# Patient Record
Sex: Male | Born: 1970 | Race: Black or African American | Hispanic: No | Marital: Married
Health system: Southern US, Community
[De-identification: ages and names within clinical notes are randomized; demographics above are authoritative.]

---

## 2006-01-14 ENCOUNTER — Emergency Department: Payer: Self-pay | Admitting: General Practice

## 2009-02-19 ENCOUNTER — Emergency Department: Payer: Self-pay | Admitting: Emergency Medicine

## 2009-11-06 ENCOUNTER — Emergency Department: Payer: Self-pay | Admitting: Emergency Medicine

## 2009-11-10 ENCOUNTER — Emergency Department: Payer: Self-pay | Admitting: Emergency Medicine
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
## 2012-08-18 ENCOUNTER — Emergency Department: Payer: Self-pay | Admitting: Emergency Medicine

## 2012-09-12 ENCOUNTER — Emergency Department: Payer: Self-pay | Admitting: Emergency Medicine

## 2012-11-09 ENCOUNTER — Ambulatory Visit: Payer: Self-pay | Admitting: Orthopedic Surgery

## 2012-11-09 ENCOUNTER — Inpatient Hospital Stay: Payer: Self-pay | Admitting: Specialist

## 2012-11-09 LAB — CBC
HCT: 42.4 % (ref 40.0–52.0)
MCHC: 34.4 g/dL (ref 32.0–36.0)
MCV: 97 fL (ref 80–100)
RDW: 13.1 % (ref 11.5–14.5)
WBC: 10.6 10*3/uL (ref 3.8–10.6)

## 2012-11-09 LAB — BASIC METABOLIC PANEL
BUN: 12 mg/dL (ref 7–18)
Chloride: 107 mmol/L (ref 98–107)
Co2: 25 mmol/L (ref 21–32)
EGFR (African American): >60
Glucose: 85 mg/dL (ref 65–99)
Osmolality: 280 (ref 275–301)
Potassium: 3.7 mmol/L (ref 3.5–5.1)

## 2013-08-21 ENCOUNTER — Emergency Department: Payer: Self-pay | Admitting: Emergency Medicine

## 2015-01-21 NOTE — Consult Note (Signed)
Brief Consult Note: Diagnosis: L knee complex contaminated laceration, poss traumatic arthrotomy.   Patient was seen by consultant.   Consult note dictated.   Recommend to proceed with surgery or procedure.   Orders entered.   Discussed with Attending MD.   Comments: plan; i/d, saline arthrogram L knee, poss i/d L knee joint risks/benefits/alternatives d/w pt and fiancee.  Electronic Signatures: Kyra SearlesSloboda, Kadience Macchi F (MD)  (Signed 09-Feb-14 05:41)  Authored: Brief Consult Note   Last Updated: 09-Feb-14 05:41 by Kyra SearlesSloboda, Andrina Locken F (MD)
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
## 2015-01-21 NOTE — Consult Note (Signed)
PATIENT NAME:  Servando SalinaABORN, Hitesh MR#:  [id number] DATE OF BIRTH:  May 20, 1971  DATE OF CONSULTATION:  11/09/2012  REFERRING PHYSICIAN:   CONSULTING PHYSICIAN:  Kyra SearlesJohn F. Soniya Ashraf, MD  Addendum  We will add penicillin IV x 3 doses as well as gentamicin, a 1-time dose given the contamination of the patient's wound noted at the time of surgery today.    ____________________________ Kyra SearlesJohn F. Param Capri, MD jfs:si D: 11/09/2012 20:23:00 ET T: 11/09/2012 20:26:54 ET JOB#: [id number]  cc: Kyra SearlesJohn F. Charvis Lightner, MD, <Dictator> Kyra SearlesJOHN F Icarus Partch MD ELECTRONICALLY SIGNED 01/03/2013 12:59

## 2015-01-21 NOTE — Consult Note (Signed)
PATIENT NAME:  Jason Castro, Jason Castro MR#:  [id number] DATE OF BIRTH:  1971-06-09  DATE OF CONSULTATION:  11/09/2012  REFERRING PHYSICIAN:   CONSULTING PHYSICIAN:  Kyra SearlesJohn F. Jeneal Vogl, MD  The patient is immediately postoperative following his I and D of his left knee and wound closure. His saline arthrogram was negative for traumatic arthrotomy. His pain control has been good.   PHYSICAL EXAMINATION: Afebrile. There is systolic and diastolic hypertension. Left knee dressing clean, dry and intact. Knee immobilizer is in place. Motor and light touch sensation examination are intact at the right foot.   IMPRESSION: Status post incision and drainage of the left knee with wound closure, doing well.  PLAN:  IV antibiotics. Enteric-coated aspirin for deep vein thrombosis prophylaxis until fully weight-bearing. Knee immobilizer. Will continue to follow. The patient's care will be seen by Dr. Ernest PineHooten in the morning.     ____________________________ Kyra SearlesJohn F. Emmajane Altamura, MD jfs:aw D: 11/09/2012 12:27:58 ET T: 11/09/2012 12:58:20 ET JOB#: [id number]  cc: Kyra SearlesJohn F. Crosby Oriordan, MD, <Dictator> Kyra SearlesJOHN F Lenoir Facchini MD ELECTRONICALLY SIGNED 11/09/2012 20:30
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
